# Patient Record
Sex: Female | Born: 1985 | Race: White | Hispanic: No | Smoking: Former smoker
Health system: Southern US, Community
[De-identification: ages and names within clinical notes are randomized; demographics above are authoritative.]

## PROBLEM LIST (undated history)

## (undated) HISTORY — PX: HERNIA REPAIR: SHX51

## (undated) HISTORY — PX: OTHER SURGICAL HISTORY: SHX169

---
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
## 2006-07-20 ENCOUNTER — Emergency Department: Payer: Self-pay | Admitting: Emergency Medicine

## 2006-11-02 ENCOUNTER — Emergency Department: Payer: Self-pay | Admitting: Emergency Medicine

## 2007-12-31 ENCOUNTER — Emergency Department: Payer: Self-pay | Admitting: Emergency Medicine

## 2008-01-04 ENCOUNTER — Emergency Department: Payer: Self-pay | Admitting: Internal Medicine

## 2008-02-25 ENCOUNTER — Emergency Department: Payer: Self-pay | Admitting: Emergency Medicine

## 2008-02-27 ENCOUNTER — Emergency Department: Payer: Self-pay | Admitting: Emergency Medicine

## 2009-04-01 ENCOUNTER — Emergency Department: Payer: Self-pay | Admitting: Emergency Medicine

## 2009-05-07 ENCOUNTER — Emergency Department: Payer: Self-pay | Admitting: Emergency Medicine

## 2010-12-11 ENCOUNTER — Emergency Department: Payer: Self-pay | Admitting: Emergency Medicine

## 2010-12-13 ENCOUNTER — Emergency Department: Payer: Self-pay | Admitting: Emergency Medicine

## 2012-11-03 IMAGING — CR RIGHT INDEX FINGER 2+V
1 series · 3 of 3 positions shown · non-contrast
Comparison: none

REASON FOR EXAM: CAT SCRATCH/BITE
COMMENTS:   May transport without cardiac monitor

PROCEDURE:     DXR - DXR FINGER INDEX 2ND DIGIT RT HA  - December 11, 2010  [DATE]
RESULT:     Comparison: None.

[Series 1: view not recorded · 0.17mm/px · 3 of 3 slices shown]
[im 1/3]
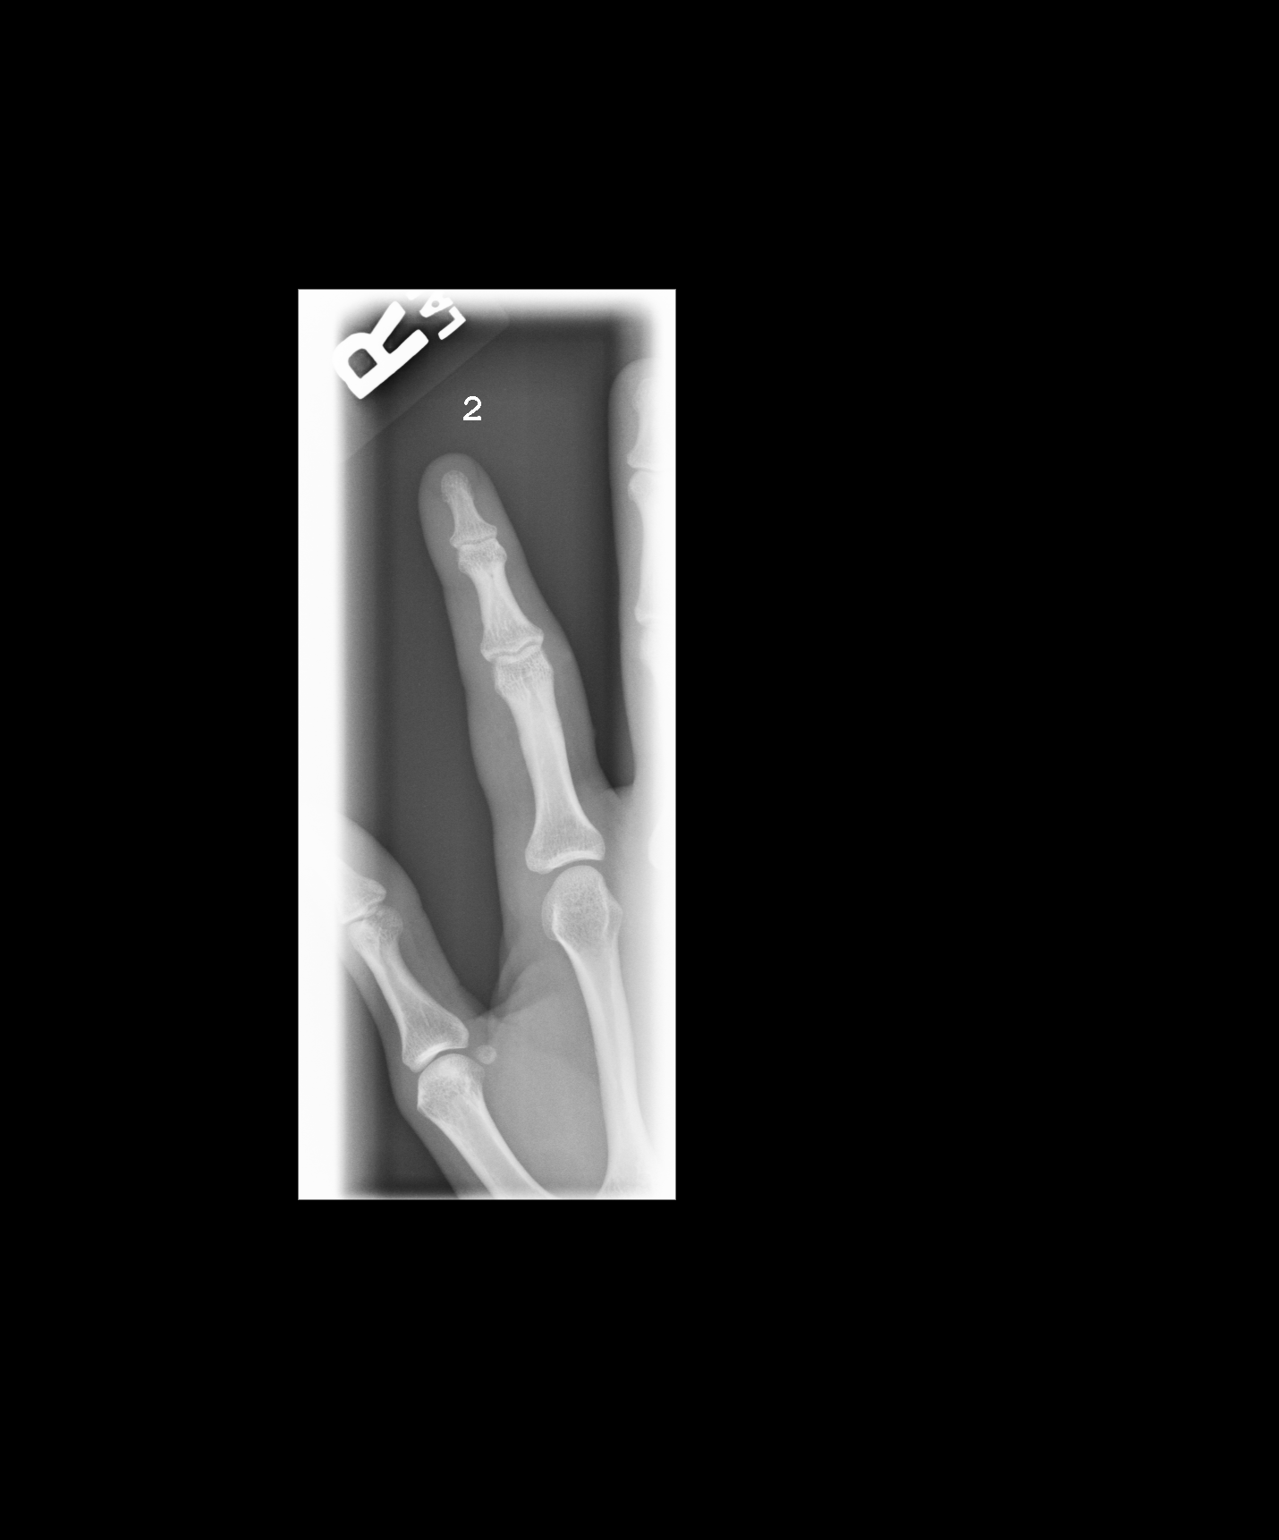
[im 2/3]
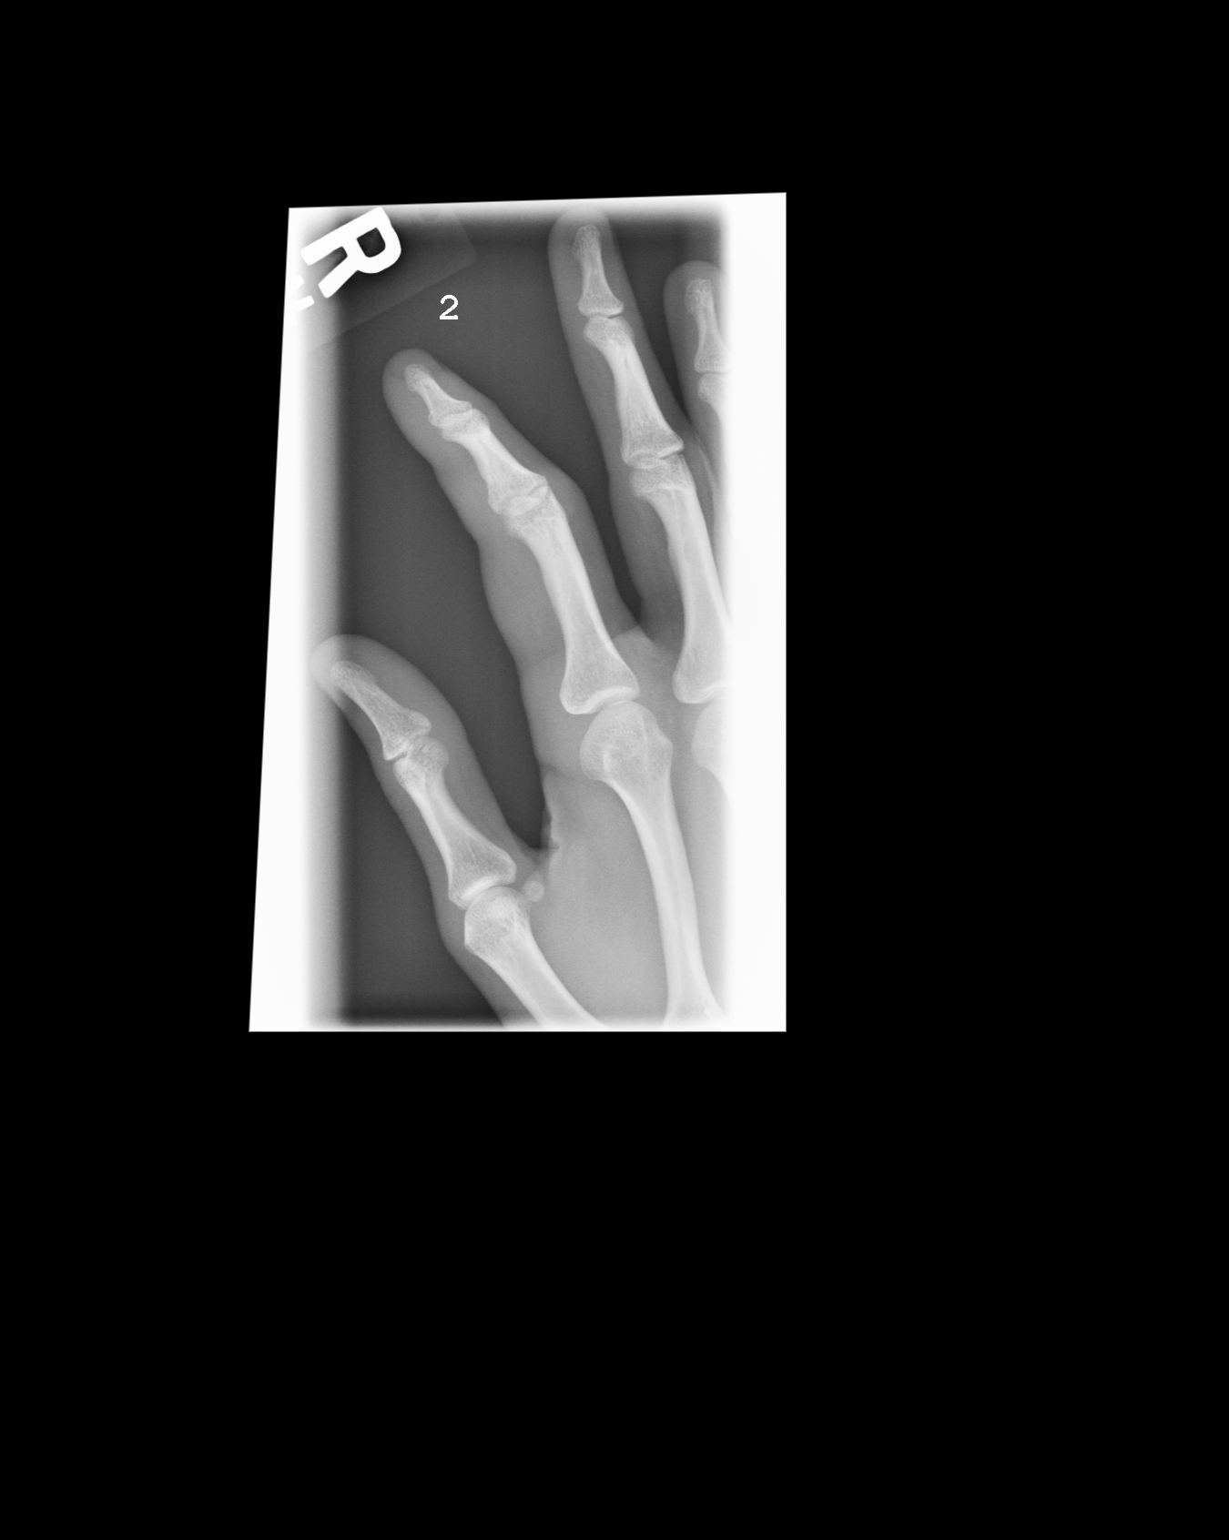
[im 3/3]
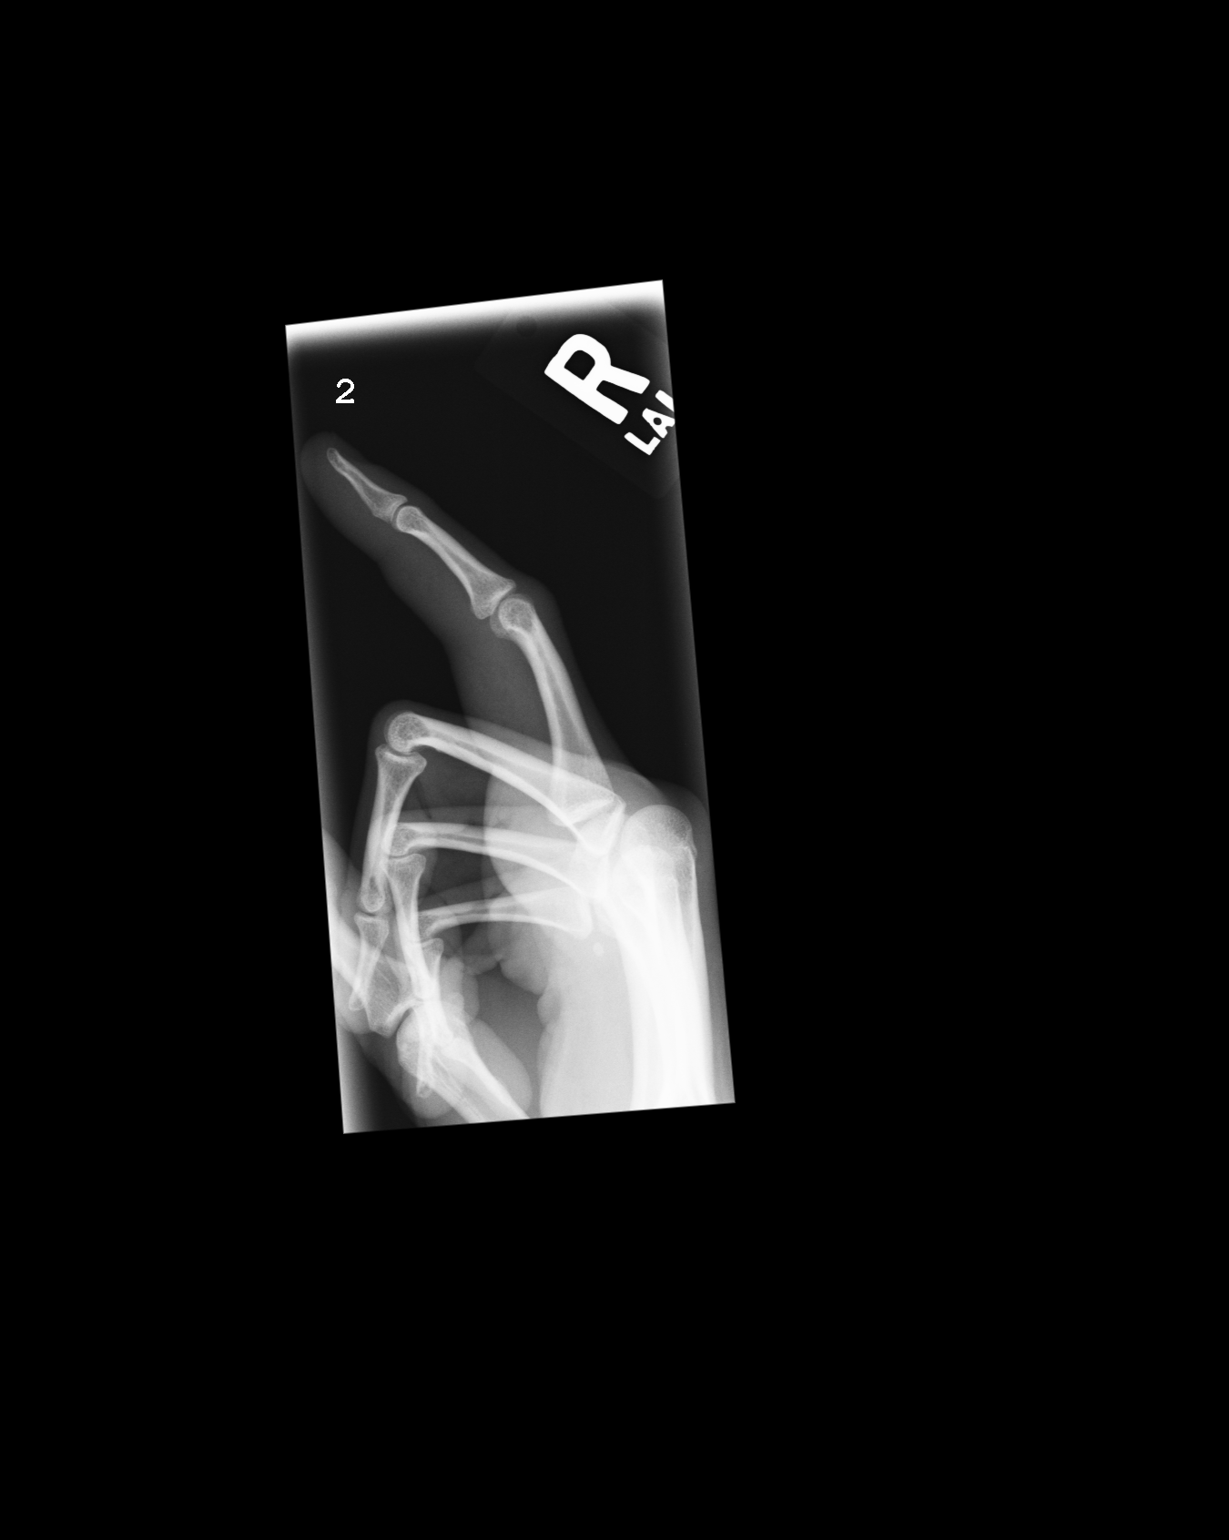

[3 of 3 positions shown; findings below may reference images not displayed]

FINDINGS: No acute fracture. Joint spaces are maintained. No soft tissue gas. No
radiopaque foreign body.
IMPRESSION: No acute fracture or radiopaque foreign body.

## 2013-06-03 ENCOUNTER — Emergency Department (INDEPENDENT_AMBULATORY_CARE_PROVIDER_SITE_OTHER)
Admission: EM | Admit: 2013-06-03 | Discharge: 2013-06-03 | Disposition: A | Discharge status: Home / Self Care | Source: Home / Self Care | Attending: Family Medicine | Admitting: Family Medicine

## 2013-06-03 ENCOUNTER — Encounter (HOSPITAL_COMMUNITY): Payer: Self-pay | Admitting: Emergency Medicine

## 2013-06-03 DIAGNOSIS — J069 Acute upper respiratory infection, unspecified: Secondary | ICD-10-CM

## 2013-06-03 MED ORDER — AZITHROMYCIN 250 MG PO TABS: ORAL_TABLET | ORAL | Status: DC

## 2013-06-03 MED ORDER — FLUTICASONE PROPIONATE 50 MCG/ACT NA SUSP: 2.0000 | Freq: Two times a day (BID) | NASAL | Status: DC

## 2013-06-03 MED ORDER — FEXOFENADINE HCL 180 MG PO TABS: 180.0000 mg | ORAL_TABLET | Freq: Every day | ORAL | Status: DC

## 2013-06-03 NOTE — ED Provider Notes (Signed)
CSN: [id number]     Arrival date & time 06/03/13  0945 History   None    Chief Complaint  Patient presents with  . Sore Throat    denies fever. vomiting several days prior  and loose stools states under alot of stress   (Consider location/radiation/quality/duration/timing/severity/associated sxs/prior Treatment) Patient is a 27 y.o. female presenting with pharyngitis. The history is provided by the patient.  Sore Throat This is a new problem. The current episode started yesterday. The problem has been gradually worsening. The symptoms are aggravated by swallowing.    History reviewed. No pertinent past medical history. History reviewed. No pertinent past surgical history. History reviewed. No pertinent family history. History  Substance Use Topics  . Smoking status: Current Every Day Smoker -- 0.50 packs/day    Types: Cigarettes  . Smokeless tobacco: Not on file  . Alcohol Use: Yes   OB History   Grav Para Term Preterm Abortions TAB SAB Ect Mult Living                 Review of Systems  Constitutional: Negative.   HENT: Positive for congestion, sore throat, rhinorrhea and postnasal drip.   Respiratory: Positive for cough.     Allergies  Amoxicillin; Penicillins; and Sulfa antibiotics  Home Medications   Current Outpatient Rx  Name  Route  Sig  Dispense  Refill  . azithromycin (ZITHROMAX Z-PAK) 250 MG tablet      Take as directed on pack   6 each   0   . fexofenadine (ALLEGRA) 180 MG tablet   Oral   Take 1 tablet (180 mg total) by mouth daily.   30 tablet   1   . fluticasone (FLONASE) 50 MCG/ACT nasal spray   Nasal   Place 2 sprays into the nose 2 (two) times daily.   1 g   2    BP 126/85  Pulse 88  Temp(Src) 97.5 F (36.4 C) (Oral)  Resp 14  SpO2 100%  LMP 05/27/2013 Physical Exam  Nursing note and vitals reviewed. Constitutional: She is oriented to person, place, and time. She appears well-developed and well-nourished.  HENT:  Head:  Normocephalic.  Right Ear: External ear normal.  Left Ear: External ear normal.  Nose: Nose normal.  Mouth/Throat: Oropharynx is clear and moist.  Eyes: Pupils are equal, round, and reactive to light.  Neck: Normal range of motion. Neck supple.  Cardiovascular: Normal rate, normal heart sounds and intact distal pulses.   Pulmonary/Chest: Effort normal and breath sounds normal.  Lymphadenopathy:    She has no cervical adenopathy.  Neurological: She is alert and oriented to person, place, and time.  Skin: Skin is warm and dry.    ED Course  Procedures (including critical care time) Labs Review Labs Reviewed - No data to display Imaging Review No results found.  MDM      Linna Hoff, MD 06/03/13 [phone]

## 2013-06-03 NOTE — ED Notes (Signed)
C/o sore throat onset yesterday.  Vomiting and diarrhea several days prior. Denies fever.

## 2021-12-29 ENCOUNTER — Other Ambulatory Visit: Payer: Self-pay

## 2021-12-29 ENCOUNTER — Encounter (HOSPITAL_BASED_OUTPATIENT_CLINIC_OR_DEPARTMENT_OTHER): Payer: Self-pay | Admitting: Emergency Medicine

## 2021-12-29 ENCOUNTER — Emergency Department (HOSPITAL_BASED_OUTPATIENT_CLINIC_OR_DEPARTMENT_OTHER)
Admission: EM | Admit: 2021-12-29 | Discharge: 2021-12-29 | Disposition: A | Discharge status: Home / Self Care | Attending: Emergency Medicine | Admitting: Emergency Medicine

## 2021-12-29 DIAGNOSIS — M7918 Myalgia, other site: Secondary | ICD-10-CM | POA: Insufficient documentation

## 2021-12-29 DIAGNOSIS — R531 Weakness: Secondary | ICD-10-CM | POA: Diagnosis not present

## 2021-12-29 DIAGNOSIS — N938 Other specified abnormal uterine and vaginal bleeding: Secondary | ICD-10-CM | POA: Diagnosis present

## 2021-12-29 DIAGNOSIS — N921 Excessive and frequent menstruation with irregular cycle: Secondary | ICD-10-CM

## 2021-12-29 LAB — COMPREHENSIVE METABOLIC PANEL
ALT: 8 U/L (ref 0–44)
AST: 11 U/L — ABNORMAL LOW (ref 15–41)
Albumin: 4.4 g/dL (ref 3.5–5.0)
Alkaline Phosphatase: 43 U/L (ref 38–126)
Anion gap: 9 (ref 5–15)
BUN: 7 mg/dL (ref 6–20)
CO2: 25 mmol/L (ref 22–32)
Calcium: 9.5 mg/dL (ref 8.9–10.3)
Chloride: 103 mmol/L (ref 98–111)
Creatinine, Ser: 0.78 mg/dL (ref 0.44–1.00)
GFR, Estimated: >60 mL/min (ref >60)
Glucose, Bld: 93 mg/dL (ref 70–99)
Potassium: 3.7 mmol/L (ref 3.5–5.1)
Sodium: 137 mmol/L (ref 135–145)
Total Bilirubin: 0.6 mg/dL (ref 0.3–1.2)
Total Protein: 7.4 g/dL (ref 6.5–8.1)

## 2021-12-29 LAB — CBC WITH DIFFERENTIAL/PLATELET
Abs Immature Granulocytes: 0.03 10*3/uL (ref 0.00–0.07)
Basophils Absolute: 0 10*3/uL (ref 0.0–0.1)
Basophils Relative: 0 %
Eosinophils Absolute: 0 10*3/uL (ref 0.0–0.5)
Eosinophils Relative: 0 %
HCT: 41 % (ref 36.0–46.0)
Hemoglobin: 14.1 g/dL (ref 12.0–15.0)
Immature Granulocytes: 0 %
Lymphocytes Relative: 17 %
Lymphs Abs: 1.4 10*3/uL (ref 0.7–4.0)
MCH: 30 pg (ref 26.0–34.0)
MCHC: 34.4 g/dL (ref 30.0–36.0)
MCV: 87.2 fL (ref 80.0–100.0)
Monocytes Absolute: 0.5 10*3/uL (ref 0.1–1.0)
Monocytes Relative: 6 %
Neutro Abs: 6.2 10*3/uL (ref 1.7–7.7)
Neutrophils Relative %: 77 %
Platelets: 167 10*3/uL (ref 150–400)
RBC: 4.7 MIL/uL (ref 3.87–5.11)
RDW: 12.3 % (ref 11.5–15.5)
WBC: 8.2 10*3/uL (ref 4.0–10.5)
nRBC: 0 % (ref 0.0–0.2)

## 2021-12-29 LAB — URINALYSIS, ROUTINE W REFLEX MICROSCOPIC
Bilirubin Urine: NEGATIVE
Glucose, UA: NEGATIVE mg/dL
Ketones, ur: 40 mg/dL — AB
Leukocytes,Ua: NEGATIVE
Nitrite: NEGATIVE
Protein, ur: 30 mg/dL — AB
Specific Gravity, Urine: 1.036 — ABNORMAL HIGH (ref 1.005–1.030)
pH: 6 (ref 5.0–8.0)

## 2021-12-29 LAB — PREGNANCY, URINE: Preg Test, Ur: NEGATIVE

## 2021-12-29 MED ORDER — SODIUM CHLORIDE 0.9 % IV BOLUS
1000.0000 mL | Freq: Once | INTRAVENOUS | Status: AC
  Administered 2021-12-29: 1000 mL via INTRAVENOUS

## 2021-12-29 NOTE — ED Provider Notes (Signed)
?[facility] ?Provider Note ? ? ?CSN: [id number] ?Arrival date & time: 12/29/21  1250 ? ?  ? ?History ? ?Chief Complaint  ?Patient presents with  ? Generalized Body Aches  ? ? ?DODY SMARTT is a 36 y.o. female here presenting with body aches.  Patient states that she had heavy periods usually.  She states that she finished her menstrual cycle about 2 weeks ago.  She then started bleeding several days ago and she has generalized weakness.  She also has some loss of appetite as well.  She states that she had some urinary frequency that improved.  She has GYN follow-up but wants to get checked out to make sure she is not anemic ? ?The history is provided by the patient.  ? ?  ? ?Home Medications ?Prior to Admission medications   ?Medication Sig Start Date End Date Taking? Authorizing Provider  ?azithromycin (ZITHROMAX Z-PAK) 250 MG tablet Take as directed on pack 06/03/13   Linna Hoff, MD  ?fexofenadine (ALLEGRA) 180 MG tablet Take 1 tablet (180 mg total) by mouth daily. 06/03/13   Linna Hoff, MD  ?fluticasone (FLONASE) 50 MCG/ACT nasal spray Place 2 sprays into the nose 2 (two) times daily. 06/03/13   Linna Hoff, MD  ?   ? ?Allergies    ?Amoxicillin, Penicillins, and Sulfa antibiotics   ? ?Review of Systems   ?Review of Systems  ?Genitourinary:  Positive for vaginal bleeding.  ?All other systems reviewed and are negative. ? ?Physical Exam ?Updated Vital Signs ?BP (!) 122/106   Pulse 73   Temp 98.4 ?F (36.9 ?C) (Oral)   Resp 18   Ht 5\' 7"  (1.702 m)   Wt 63.5 kg   LMP 12/12/2021 (Approximate)   SpO2 99%   BMI 21.93 kg/m?  ?Physical Exam ?Vitals and nursing note reviewed.  ?Constitutional:   ?   Appearance: Normal appearance.  ?HENT:  ?   Head: Normocephalic.  ?   Nose: Nose normal.  ?   Mouth/Throat:  ?   Mouth: Mucous membranes are moist.  ?Eyes:  ?   Extraocular Movements: Extraocular movements intact.  ?   Pupils: Pupils are equal, round, and reactive to light.   ?Cardiovascular:  ?   Rate and Rhythm: Normal rate and regular rhythm.  ?   Pulses: Normal pulses.  ?   Heart sounds: Normal heart sounds.  ?Pulmonary:  ?   Effort: Pulmonary effort is normal.  ?   Breath sounds: Normal breath sounds.  ?Abdominal:  ?   General: Abdomen is flat.  ?   Palpations: Abdomen is soft.  ?Musculoskeletal:     ?   General: Normal range of motion.  ?   Cervical back: Normal range of motion and neck supple.  ?Skin: ?   General: Skin is warm.  ?   Capillary Refill: Capillary refill takes less than 2 seconds.  ?Neurological:  ?   General: No focal deficit present.  ?   Mental Status: She is alert and oriented to person, place, and time.  ?Psychiatric:     ?   Mood and Affect: Mood normal.     ?   Behavior: Behavior normal.  ? ? ?ED Results / Procedures / Treatments   ?Labs ?(all labs ordered are listed, but only abnormal results are displayed) ?Labs Reviewed  ?URINALYSIS, ROUTINE W REFLEX MICROSCOPIC - Abnormal; Notable for the following components:  ?    Result Value  ? APPearance HAZY (*)   ?  Specific Gravity, Urine 1.036 (*)   ? Hgb urine dipstick LARGE (*)   ? Ketones, ur 40 (*)   ? Protein, ur 30 (*)   ? All other components within normal limits  ?COMPREHENSIVE METABOLIC PANEL - Abnormal; Notable for the following components:  ? AST 11 (*)   ? All other components within normal limits  ?PREGNANCY, URINE  ?CBC WITH DIFFERENTIAL/PLATELET  ? ? ?EKG ?None ? ?Radiology ?No results found. ? ?Procedures ?Procedures  ? ? ?Medications Ordered in ED ?Medications  ?sodium chloride 0.9 % bolus 1,000 mL (1,000 mLs Intravenous New Bag/Given 12/29/21 1516)  ? ? ?ED Course/ Medical Decision Making/ A&P ?  ?                        ?Medical Decision Making ?MEIYA WISLER is a 36 y.o. female here presenting with irregular vaginal bleeding.  Patient also has some weakness.  1 to make sure she is not anemic from vaginal bleeding.  Patient has some spotting right now.  She is hemodynamically stable.  We will  get CBC and CMP and urinalysis and hydrate and reassess ? ?4:31 PM ?CBC showed hemoglobin of 14.  White blood cell count is normal.  Urinalysis showed ketones and some blood but no obvious infection. Patient was hydrated in the ED and felt better.  She has OB follow-up next week and I told her to follow-up with her OB doctor to discuss birth control options to control her bleeding.  ? ? ?Problems Addressed: ?Menometrorrhagia: acute illness or injury ? ?Amount and/or Complexity of Data Reviewed ?Labs: ordered. Decision-making details documented in ED Course. ? ?Final Clinical Impression(s) / ED Diagnoses ?Final diagnoses:  ?None  ? ? ?Rx / DC Orders ?ED Discharge Orders   ? ? None  ? ?  ? ? ?  ?Charlynne Pander, MD ?12/29/21 [phone] ? ?

## 2021-12-29 NOTE — ED Triage Notes (Signed)
Pt from  home c/o generalized body aches, loss of appetite, headache and vaginal cramping. Pt states that she is also experiencing irregular periods and thinks it may be related. Pt take aleve before for back and muscle pain before coming and seems to be helping.  ?

## 2021-12-29 NOTE — Discharge Instructions (Signed)
Your blood count is normal today. ? ?I think your symptoms is likely from vaginal bleeding. ? ?Please stay hydrated  ? ?Talk to your [facility] doctor about birth control options ? ?Return to ER if you have uncontrolled bleeding, passing out, worse headaches, vomiting, fevers ?

## 2023-11-13 ENCOUNTER — Encounter: Payer: Self-pay | Admitting: Internal Medicine

## 2023-11-13 ENCOUNTER — Ambulatory Visit (INDEPENDENT_AMBULATORY_CARE_PROVIDER_SITE_OTHER): Admitting: Internal Medicine

## 2023-11-13 VITALS — BP 90/60 | HR 87 | Temp 98.4°F | Resp 18 | Ht 66.0 in | Wt 175.0 lb

## 2023-11-13 DIAGNOSIS — T781XXD Other adverse food reactions, not elsewhere classified, subsequent encounter: Secondary | ICD-10-CM | POA: Diagnosis not present

## 2023-11-13 DIAGNOSIS — L501 Idiopathic urticaria: Secondary | ICD-10-CM

## 2023-11-13 DIAGNOSIS — Z881 Allergy status to other antibiotic agents status: Secondary | ICD-10-CM | POA: Diagnosis not present

## 2023-11-13 DIAGNOSIS — J3089 Other allergic rhinitis: Secondary | ICD-10-CM

## 2023-11-13 DIAGNOSIS — T781XXA Other adverse food reactions, not elsewhere classified, initial encounter: Secondary | ICD-10-CM

## 2023-11-13 MED ORDER — AMMONIUM LACTATE 12 % EX CREA: 1.0000 | TOPICAL_CREAM | CUTANEOUS | 0 refills | Status: AC | PRN

## 2023-11-13 NOTE — Patient Instructions (Signed)
 Adverse Food Reaction  Two episodes of possible food-related allergic reactions in October and December. Symptoms included tingling, headache, nasal congestion, and dizziness. No skin symptoms, nausea, vomiting, or diarrhea. No clear food trigger identified as patient has since tolerated all foods involved in the reactions. -Will get tryptase and CU index to evaluate for spontaneous allergic reactions  -Return to clinic for food allergy testing (1-72 food panel)  -Consider starting preventative antihistamines if reactions recur  -Provide EpiPen and action plan for potential future reactions. -Consider Xolair if reactions continue.  Drug Allergy History of urticaria with sulfa drugs and another unidentified medication. No anaphylaxis or severe reactions reported. -Continue avoidance of known penicillins and sulfa drugs   Keratosis Pilaris Mild symptoms reported. -Prescribe Amlactin twice a day as needed   Follow-up: return for skin testing (1-72) Hold all antihistamines for 3 days prior   Thank you so much for letting me partake in your care today.  Don't hesitate to reach out if you have any additional concerns!  Ferol Luz, MD  [facility] [place], [place]

## 2023-11-13 NOTE — Progress Notes (Signed)
 NEW PATIENT Date of Service/Encounter:  11/13/23 Referring provider: Loura Pardon* Primary care provider: Loura Pardon, PA  Subjective:  Anne Aguilar is a 38 y.o. female  presenting today for evaluation of allergic reaction  History obtained from: chart review and patient.   Discussed the use of AI scribe software for clinical note transcription with the patient, who gave verbal consent to proceed.  History of Present Illness   Anne Aguilar is a 38 year old female who presents with recent episodes of suspected food-related allergic reactions.  She experienced two significant allergic reactions, one in October and another in December. The first incident occurred after consuming shrimp scampi at home, where she developed symptoms within minutes of taking one to two bites. Symptoms included tingling, warmth in her ears, headache, nasal congestion, dizziness, and difficulty breathing. Benadryl alleviated the symptoms, and she received prednisone in the ER. She avoided shrimp until tested by a doctor and has since consumed it without issues.  She is also consumed wheat and dairy without issues as well as garlic.  The second reaction in December involved a pasta dish with garlic butter and chicken, using chickpea noodles. She experienced similar symptoms of ear warmth and burning shortly after eating a few bites. Benadryl and prednisone prevented the symptoms from escalating, and she did not require ER care. She has since consumed garlic, chickpea noodles, and chicken without problems.  She has a history of urticaria with penicillin and sulfa drugs. There is no history of asthma or significant seasonal allergies, although she experiences occasional runny or stuffy nose and sinus infections during pollen season. She does not regularly take antihistamines but uses Alka-Seltzer when feeling ill.  She reports having keratosis pilaris, which she manages with moisturizing.       Specific IgE obtained by primary care to egg, peanut, soy, milk, clam, shrimp, walnut, codfish, scallop, wheat, corn, sesame which was all negative.    Other allergy screening: Asthma: no Rhino conjunctivitis:  yes Food allergy: yes Medication allergy: yes Hymenoptera allergy: Penicillin and sulfa drugs cause urticaria Urticaria: no Eczema:no History of recurrent infections suggestive of immunodeficency: no Vaccinations are up to date.   Past Medical History: History reviewed. No pertinent past medical history. Medication List:  Current Outpatient Medications  Medication Sig Dispense Refill   ammonium lactate (AMLACTIN) 12 % cream Apply 1 Application topically as needed for dry skin. 385 g 0   diphenhydrAMINE (BENADRYL) 25 mg capsule Take 25 mg by mouth every 6 (six) hours as needed.     EPINEPHrine (EPIPEN 2-PAK) 0.3 mg/0.3 mL IJ SOAJ injection Inject 0.3 mg into the muscle as needed for anaphylaxis.     FAMOTIDINE PO Take 20 mg by mouth daily.     pantoprazole (PROTONIX) 40 MG tablet Take 40 mg by mouth daily.     No current facility-administered medications for this visit.   Known Allergies:  Allergies  Allergen Reactions   Amoxicillin    Penicillins    Sulfa Antibiotics    Past Surgical History: Past Surgical History:  Procedure Laterality Date   HERNIA REPAIR     STOMACH MASS     Family History: Family History  Problem Relation Age of Onset   Allergic rhinitis Neg Hx    Angioedema Neg Hx    Asthma Neg Hx    Atopy Neg Hx    Eczema Neg Hx    Immunodeficiency Neg Hx    Urticaria Neg Hx    Social  History: Arlyn lives [facility] that is 37 years old, hardwood in family room, carpet in bedroom gas heat, central cooling, 3 cats and 1 dog with access to bedroom, vape exposure inside care and home, 1/2 ppd per day as an active vapor, .   ROS:  All other systems negative except as noted per HPI.  Objective:  Blood pressure 90/60, pulse 87, temperature 98.4 F (36.9  C), temperature source Temporal, resp. rate 18, height 5\' 6"  (1.676 m), weight 175 lb (79.4 kg), SpO2 98%. Body mass index is 28.25 kg/m. Physical Exam:  General Appearance:  Alert, cooperative, no distress, appears stated age  Head:  Normocephalic, without obvious abnormality, atraumatic  Eyes:  Conjunctiva clear, EOM's intact  Ears EACs normal bilaterally  Nose: Nares normal, normal mucosa, no visible anterior polyps, and septum midline  Throat: Lips, tongue normal; teeth and gums normal, normal posterior oropharynx  Neck: Supple, symmetrical  Lungs:   clear to auscultation bilaterally, Respirations unlabored, no coughing  Heart:  regular rate and rhythm and no murmur, Appears well perfused  Extremities: No edema  Skin: Skin color, texture, turgor normal and no rashes or lesions on visualized portions of skin  Neurologic: No gross deficits   Diagnostics: None done    Labs:  Lab Orders         Tryptase         Chronic Urticaria       Assessment and Plan  Assessment and Plan    Adverse Food Reaction  Two episodes of possible food-related allergic reactions in October and December. Symptoms included tingling, headache, nasal congestion, and dizziness. No skin symptoms, nausea, vomiting, or diarrhea. No clear food trigger identified as patient has since tolerated all foods involved in the reactions. -Will get tryptase and CU index to evaluate for spontaneous allergic reactions  -Return to clinic for food allergy testing (1-72 food panel)  -Consider starting preventative antihistamines if reactions recur  -Provide EpiPen and action plan for potential future reactions. -Consider Xolair if reactions continue.  Drug Allergy History of urticaria with sulfa drugs and another unidentified medication. No anaphylaxis or severe reactions reported. -Continue avoidance of known penicillins and sulfa drugs   Keratosis Pilaris Mild symptoms reported. -Prescribe Amlactin twice a day as  needed   Follow-up: return for skin testing (1-72) Hold all antihistamines for 3 days prior      This note in its entirety was forwarded to the Provider who requested this consultation.  Other:  done   Thank you for your kind referral. I appreciate the opportunity to take part in Jaleah's care. Please do not hesitate to contact me with questions.  Sincerely,  Thank you so much for letting me partake in your care today.  Don't hesitate to reach out if you have any additional concerns!  Ferol Luz, MD  [facility] [place], [place]

## 2023-11-20 ENCOUNTER — Ambulatory Visit (INDEPENDENT_AMBULATORY_CARE_PROVIDER_SITE_OTHER): Admitting: Internal Medicine

## 2023-11-20 DIAGNOSIS — T781XXD Other adverse food reactions, not elsewhere classified, subsequent encounter: Secondary | ICD-10-CM | POA: Diagnosis not present

## 2023-11-20 DIAGNOSIS — L501 Idiopathic urticaria: Secondary | ICD-10-CM

## 2023-11-20 DIAGNOSIS — J3089 Other allergic rhinitis: Secondary | ICD-10-CM

## 2023-11-20 DIAGNOSIS — Z881 Allergy status to other antibiotic agents status: Secondary | ICD-10-CM

## 2023-11-20 LAB — TRYPTASE: Tryptase: 6.8 ug/L (ref 2.2–13.2)

## 2023-11-20 LAB — CHRONIC URTICARIA: cu index: 10.3 — ABNORMAL HIGH (ref <10)

## 2023-11-20 NOTE — Patient Instructions (Addendum)
 Marland Kitchen

## 2023-11-20 NOTE — Progress Notes (Unsigned)
 Date of Service/Encounter:  11/21/23  Allergy testing appointment   Initial visit on 11/13/23, seen for food allergy .  Please see that note for additional details.  Today reports for allergy diagnostic testing:    DIAGNOSTICS:  Skin Testing: Food allergy panel. Adequate positive and negative controls Results discussed with patient/family.  Food Adult Perc - 11/20/23 1400     Time Antigen Placed 1420    Allergen Manufacturer Waynette Buttery    Location Back    Number of allergen test 74     Control-buffer 50% Glycerol Negative    Control-Histamine 4+    1. Peanut Negative    2. Soybean Negative    3. Wheat Negative    4. Sesame Negative    5. Milk, Cow Negative    6. Casein Negative    7. Egg White, Chicken Negative    8. Shellfish Mix Negative    9. Fish Mix Negative    10. Cashew Negative    11. Walnut Food Negative    12. Almond Negative    13. Hazelnut Negative    14. Pecan Food Negative    15. Pistachio Negative    16. [place] Nut Negative    17. Coconut Negative    18. Trout Negative    19. Tuna Negative    20. Salmon Negative    21. Flounder Negative    22. Codfish Negative    23. Shrimp Negative    24. Crab Negative    25. Lobster Negative    26. Oyster Negative    27. Scallops Negative    28. Oat  Negative    29. Rice Negative    30. Barley Negative    31. Rye  Negative    32. Hops Negative    33. [place] Meat Negative    34. Chicken Meat Negative    35. Pork Negative    36. Beef Negative    37. Lamb Negative    38. Tomato Negative    39. White Potato Negative    40. Sweet Potato Negative    41. Pea, Green/English Negative    42. Navy Bean Negative    43. Green Beans Negative    44. Squash Negative    45. Green Pepper Negative    46. Mushrooms Negative    47. Onion Negative    48. Avocado Negative    49. Cabbage Negative    50. Carrots Negative    51. Celery Negative    52. Corn Negative    53. Cucumber Negative    54. Grape (White seedless)  Negative    55. Orange  Negative    56. Lemon Negative    57. Banana Negative    58. Apple Negative    59. Peach Negative    60. Strawberry Negative    61. Blueberry Negative    62. Cherry Negative    63. Cantaloupe Negative    64. Watermelon Negative    65. Pineapple Negative    66. Chocolate/Cacao Bean Negative    67. Cinnamon Negative    68. Nutmeg Negative    69. Ginger Negative    70. Garlic Negative    71. Pepper, Black Negative    72. Mustard Negative             Allergy testing results were read and interpreted by myself, documented by clinical staff.  Patient provided with copy of allergy testing along with avoidance measures when indicated.   Ferol Luz,  MD  [facility]
